# Patient Record
Sex: Female | Born: 1970 | Race: White | Hispanic: No | Smoking: Current every day smoker
Health system: Southern US, Community
[De-identification: ages and names within clinical notes are randomized; demographics above are authoritative.]

## PROBLEM LIST (undated history)

## (undated) DIAGNOSIS — N200 Calculus of kidney: Secondary | ICD-10-CM

## (undated) DIAGNOSIS — G43909 Migraine, unspecified, not intractable, without status migrainosus: Secondary | ICD-10-CM

## (undated) DIAGNOSIS — E785 Hyperlipidemia, unspecified: Secondary | ICD-10-CM

## (undated) DIAGNOSIS — F419 Anxiety disorder, unspecified: Secondary | ICD-10-CM

## (undated) DIAGNOSIS — Z973 Presence of spectacles and contact lenses: Secondary | ICD-10-CM

## (undated) HISTORY — DX: Hyperlipidemia, unspecified: E78.5

## (undated) HISTORY — DX: Migraine, unspecified, not intractable, without status migrainosus: G43.909

## (undated) HISTORY — DX: Anxiety disorder, unspecified: F41.9

## (undated) HISTORY — PX: TUBAL LIGATION: SHX77

---

## 2006-03-09 ENCOUNTER — Observation Stay: Payer: Self-pay

## 2006-03-14 ENCOUNTER — Observation Stay: Payer: Self-pay

## 2006-03-22 ENCOUNTER — Inpatient Hospital Stay: Payer: Self-pay | Admitting: Obstetrics and Gynecology

## 2010-12-05 ENCOUNTER — Emergency Department: Payer: Self-pay | Admitting: Emergency Medicine

## 2016-02-15 ENCOUNTER — Encounter: Payer: Self-pay | Admitting: Emergency Medicine

## 2016-02-15 ENCOUNTER — Emergency Department: Payer: Medicaid Other

## 2016-02-15 ENCOUNTER — Emergency Department
Admission: EM | Admit: 2016-02-15 | Discharge: 2016-02-16 | Disposition: A | Discharge status: Home / Self Care | Payer: Medicaid Other | Attending: Emergency Medicine | Admitting: Emergency Medicine

## 2016-02-15 DIAGNOSIS — F17210 Nicotine dependence, cigarettes, uncomplicated: Secondary | ICD-10-CM | POA: Insufficient documentation

## 2016-02-15 DIAGNOSIS — R1031 Right lower quadrant pain: Secondary | ICD-10-CM | POA: Diagnosis present

## 2016-02-15 DIAGNOSIS — N201 Calculus of ureter: Secondary | ICD-10-CM | POA: Diagnosis not present

## 2016-02-15 DIAGNOSIS — Z3202 Encounter for pregnancy test, result negative: Secondary | ICD-10-CM | POA: Diagnosis not present

## 2016-02-15 HISTORY — DX: Calculus of kidney: N20.0

## 2016-02-15 LAB — URINALYSIS COMPLETE WITH MICROSCOPIC (ARMC ONLY)
BACTERIA UA: NONE SEEN
Bilirubin Urine: NEGATIVE
GLUCOSE, UA: NEGATIVE mg/dL
HGB URINE DIPSTICK: NEGATIVE
LEUKOCYTES UA: NEGATIVE
Nitrite: NEGATIVE
PH: 9 — AB (ref 5.0–8.0)
Protein, ur: 30 mg/dL — AB
SPECIFIC GRAVITY, URINE: 1.015 (ref 1.005–1.030)

## 2016-02-15 LAB — POCT PREGNANCY, URINE: PREG TEST UR: NEGATIVE

## 2016-02-15 LAB — COMPREHENSIVE METABOLIC PANEL
ALBUMIN: 4 g/dL (ref 3.5–5.0)
ALT: 15 U/L (ref 14–54)
ANION GAP: 8 (ref 5–15)
AST: 19 U/L (ref 15–41)
Alkaline Phosphatase: 73 U/L (ref 38–126)
BILIRUBIN TOTAL: 0.8 mg/dL (ref 0.3–1.2)
BUN: 13 mg/dL (ref 6–20)
CALCIUM: 9.3 mg/dL (ref 8.9–10.3)
CO2: 21 mmol/L — ABNORMAL LOW (ref 22–32)
Chloride: 111 mmol/L (ref 101–111)
Creatinine, Ser: 1.03 mg/dL — ABNORMAL HIGH (ref 0.44–1.00)
GFR calc Af Amer: >60 mL/min (ref >60)
GFR calc non Af Amer: >60 mL/min (ref >60)
GLUCOSE: 96 mg/dL (ref 65–99)
POTASSIUM: 3.5 mmol/L (ref 3.5–5.1)
Sodium: 140 mmol/L (ref 135–145)
Total Protein: 7.3 g/dL (ref 6.5–8.1)

## 2016-02-15 LAB — CBC
HCT: 41.3 % (ref 35.0–47.0)
HEMOGLOBIN: 13.8 g/dL (ref 12.0–16.0)
MCH: 30.4 pg (ref 26.0–34.0)
MCHC: 33.5 g/dL (ref 32.0–36.0)
MCV: 90.9 fL (ref 80.0–100.0)
Platelets: 247 10*3/uL (ref 150–440)
RBC: 4.54 MIL/uL (ref 3.80–5.20)
RDW: 12.5 % (ref 11.5–14.5)
WBC: 11.5 10*3/uL — ABNORMAL HIGH (ref 3.6–11.0)

## 2016-02-15 LAB — LIPASE, BLOOD: LIPASE: 28 U/L (ref 11–51)

## 2016-02-15 MED ORDER — SODIUM CHLORIDE 0.9 % IV BOLUS (SEPSIS)
1000.0000 mL | Freq: Once | INTRAVENOUS | Status: AC
  Administered 2016-02-15: 1000 mL via INTRAVENOUS

## 2016-02-15 MED ORDER — KETOROLAC TROMETHAMINE 60 MG/2ML IM SOLN
INTRAMUSCULAR | Status: AC
Start: 2016-02-15 — End: 2016-02-15
  Administered 2016-02-15: 30 mg
  Filled 2016-02-15: qty 2

## 2016-02-15 MED ORDER — SODIUM CHLORIDE 0.9 % IV SOLN
INTRAVENOUS | Status: DC
  Administered 2016-02-15: via INTRAVENOUS

## 2016-02-15 MED ORDER — MORPHINE SULFATE (PF) 4 MG/ML IV SOLN
4.0000 mg | Freq: Once | INTRAVENOUS | Status: AC
  Administered 2016-02-15: 4 mg via INTRAVENOUS
  Filled 2016-02-15: qty 1

## 2016-02-15 MED ORDER — ONDANSETRON HCL 4 MG/2ML IJ SOLN
4.0000 mg | Freq: Once | INTRAMUSCULAR | Status: AC
  Administered 2016-02-15: 4 mg via INTRAVENOUS
  Filled 2016-02-15: qty 2

## 2016-02-15 MED ORDER — KETOROLAC TROMETHAMINE 30 MG/ML IJ SOLN
30.0000 mg | Freq: Once | INTRAMUSCULAR | Status: AC
  Administered 2016-02-15: 30 mg via INTRAVENOUS
  Filled 2016-02-15: qty 1

## 2016-02-15 MED ORDER — KETOROLAC TROMETHAMINE 30 MG/ML IJ SOLN: 30.0000 mg | Freq: Once | INTRAMUSCULAR | Status: DC

## 2016-02-15 NOTE — ED Notes (Signed)
C/o right lower back and right lower quadrant pain x 2 days.  Vomiting onset today.  Denies Urinary symptoms.

## 2016-02-15 NOTE — ED Provider Notes (Signed)
[facility] Emergency Department Provider Note  ____________________________________________  Time seen: Approximately 11:34 PM  I have reviewed the triage vital signs and the nursing notes.   HISTORY  Chief Complaint Flank Pain and Abdominal Pain    HPI Shelby Copeland is a 45 y.o. female with a past medical history that includes one episode of kidney stone which she passed on her own several years ago who presents with gradual onset of 2 days of right-sided flank pain and right lower quadrant pain.  It started gradually and was mild but his increased in intensity and is now severe.  She describes it as sharp, stabbing, and pressure-like.  She had some nausea over the last 2 days and had several episodes of vomiting today.  She denies fever/chills, chest pain, shortness of breath, upper abdominal pain, dysuria, and hematuria.  She does state it feels like her prior kidney stone but worse.  She has had a tubal ligation and is having no pelvic or vaginal symptoms.  Smokes tobacco but does not drink alcohol.   Past Medical History  Diagnosis Date  . Kidney stones     There are no active problems to display for this patient.   Past Surgical History  Procedure Laterality Date  . Tubal ligation      Current Outpatient Rx  Name  Route  Sig  Dispense  Refill  .           .           .           .             Allergies Review of patient's allergies indicates no known allergies.  History reviewed. No pertinent family history.  Social History Social History  Substance Use Topics  . Smoking status: Current Every Day Smoker    Types: Cigarettes  . Smokeless tobacco: None  . Alcohol Use: No    Review of Systems Constitutional: No fever/chills Eyes: No visual changes. ENT: No sore throat. Cardiovascular: Denies chest pain. Respiratory: Denies shortness of breath. Gastrointestinal: Right lower quadrant abdominal pain that is originating in the flank  and radiating forward.  Several episodes of vomiting.  No diarrhea Genitourinary: Negative for dysuria.  No hematuria Musculoskeletal: Right-sided flank pain Skin: Negative for rash. Neurological: Negative for headaches, focal weakness or numbness.  10-point ROS otherwise negative.  ____________________________________________   PHYSICAL EXAM:  VITAL SIGNS: ED Triage Vitals  Enc Vitals Group     BP 02/15/16 1837 155/77 mmHg     Pulse Rate 02/15/16 1837 73     Resp 02/15/16 1837 20     Temp 02/15/16 1837 98.3 F (36.8 C)     Temp Source 02/15/16 1837 Oral     SpO2 02/15/16 1837 98 %     Weight 02/15/16 1837 190 lb (86.183 kg)     Height 02/15/16 1837 5\' 5"  (1.651 m)     Head Cir --      Peak Flow --      Pain Score 02/15/16 1849 10     Pain Loc --      Pain Edu? --      Excl. in GC? --     Constitutional: Alert and oriented. Well appearing and appears mildly uncomfortable but in no severe distress Eyes: Conjunctivae are normal. PERRL. EOMI. Head: Atraumatic. Nose: No congestion/rhinnorhea. Mouth/Throat: Mucous membranes are moist.  Oropharynx non-erythematous. Neck: No stridor.   Cardiovascular: Normal rate,  regular rhythm. Grossly normal heart sounds.  Good peripheral circulation. Respiratory: Normal respiratory effort.  No retractions. Lungs CTAB. Gastrointestinal: Obese, mild tenderness to palpation of the right side of the abdomen with right-sided CVA tenderness that is severe.  No rebound or guarding.  No distention.. Musculoskeletal: No lower extremity tenderness nor edema.  No joint effusions. Neurologic:  Normal speech and language. No gross focal neurologic deficits are appreciated.  Skin:  Skin is warm, dry and intact. No rash noted. Psychiatric: Mood and affect are normal. Speech and behavior are normal.  ____________________________________________   LABS (all labs ordered are listed, but only abnormal results are displayed)  Labs Reviewed   COMPREHENSIVE METABOLIC PANEL - Abnormal; Notable for the following:    CO2 21 (*)    Creatinine, Ser 1.03 (*)    All other components within normal limits  CBC - Abnormal; Notable for the following:    WBC 11.5 (*)    All other components within normal limits  URINALYSIS COMPLETEWITH MICROSCOPIC ([facility] ONLY) - Abnormal; Notable for the following:    Color, Urine YELLOW (*)    APPearance TURBID (*)    Ketones, ur TRACE (*)    pH 9.0 (*)    Protein, ur 30 (*)    Squamous Epithelial / LPF 6-30 (*)    All other components within normal limits  LIPASE, BLOOD  PREGNANCY, URINE  POCT PREGNANCY, URINE   ____________________________________________  EKG  None ____________________________________________  RADIOLOGY   Ct Renal Stone Study  02/15/2016  CLINICAL DATA:  Bilateral lower abdominal and back pain for 2 days. EXAM: CT ABDOMEN AND PELVIS WITHOUT CONTRAST TECHNIQUE: Multidetector CT imaging of the abdomen and pelvis was performed following the standard protocol without IV contrast. COMPARISON:  12/05/2010 FINDINGS: Lower chest:  Trace subsegmental atelectasis noted at the left base. Hepatobiliary: No focal abnormality in the liver on this study without intravenous contrast. No evidence of hepatomegaly. There is no evidence for gallstones, gallbladder wall thickening, or pericholecystic fluid. No intrahepatic or extrahepatic biliary dilation. Pancreas: No focal mass lesion. No dilatation of the main duct. No intraparenchymal cyst. No peripancreatic edema. Spleen: No splenomegaly. No focal mass lesion. Adrenals/Urinary Tract: No adrenal nodule or mass. 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 Mild right perinephric edema is evident. 5 x 5 x 7 mm stone is identified at the right UVJ. No evidence for right hydroureter. Left kidney is unremarkable. No left hydroureter. No ureteral stones. No bladder stones. Stomach/Bowel: Stomach is  nondistended. No gastric wall thickening. No evidence of outlet obstruction. Duodenum is normally positioned as is the ligament of Treitz. No small bowel wall thickening. No small bowel dilatation. The terminal ileum is normal. The appendix is normal. 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 Vascular/Lymphatic: There is abdominal aortic atherosclerosis without aneurysm. There is no gastrohepatic or hepatoduodenal ligament lymphadenopathy. No intraperitoneal or retroperitoneal lymphadenopathy. No pelvic sidewall lymphadenopathy. Reproductive: The uterus has normal CT imaging appearance. There is no adnexal mass. Other: No intraperitoneal free fluid. Musculoskeletal: Bone windows reveal no worrisome lytic or sclerotic osseous lesions. IMPRESSION: 1. Mild right hydronephrosis secondary to a 5 x 5 x 7 mm stone at the right UVJ. Electronically Signed   By: Kennith Center M.D.   On: 02/15/2016 22:35    ____________________________________________   PROCEDURES  Procedure(s) performed: None  Critical Care performed: No ____________________________________________   INITIAL IMPRESSION / ASSESSMENT AND PLAN / ED COURSE  Pertinent labs &  imaging results that were available during my care of the patient were reviewed by me and considered in my medical decision making (see chart for details).  The patient has a relatively large but nearly passed stone in her right UVJ.  She has no evidence of infection and does not require antibiotics.  After Toradol her pain has improved but is still present.  I had a long talk with the patient about conservative management and outpatient follow-up and possible lithotripsy in 3 days.  I spoke by phone with Dr. Apolinar Junes, the on-call urologist, who agreed with my plan.  We gave a dose of morphine 4 mg IV and Zofran 4 mg IV in the emergency department as well as 2 Percocet.  I am providing prescriptions for Percocet, Zofran, Flomax, and  Colace.  Dr. Apolinar Junes told me that the patient can be seen as a walk-in tomorrow and I relayed this to the patient and her daughter.  They understand and agree with the plan.  I gave my usual and customary return precautions.     ____________________________________________  FINAL CLINICAL IMPRESSION(S) / ED DIAGNOSES  Final diagnoses:  Ureterolithiasis      NEW MEDICATIONS STARTED DURING THIS VISIT:  New Prescriptions   DOCUSATE SODIUM (COLACE) 100 MG CAPSULE    Take 1 tablet once or twice daily as needed for constipation while taking narcotic pain medicine   ONDANSETRON (ZOFRAN) 4 MG TABLET    Take 1-2 tabs by mouth every 8 hours as needed for nausea/vomiting   OXYCODONE-ACETAMINOPHEN (ROXICET) 5-325 MG TABLET    Take 1-2 tablets by mouth every 4 (four) hours as needed for severe pain.   TAMSULOSIN (FLOMAX) 0.4 MG CAPS CAPSULE    Take 1 tablet by mouth daily until you pass the kidney stone or no longer have symptoms     Loleta Rose, MD 02/16/16 0003

## 2016-02-15 NOTE — ED Notes (Signed)
Pt requesting pain medication. MD notified, see MAR.

## 2016-02-16 ENCOUNTER — Telehealth: Payer: Self-pay | Admitting: Urology

## 2016-02-16 LAB — PREGNANCY, URINE: Preg Test, Ur: NEGATIVE

## 2016-02-16 MED ORDER — OXYCODONE-ACETAMINOPHEN 5-325 MG PO TABS
2.0000 | ORAL_TABLET | Freq: Once | ORAL | Status: AC
  Administered 2016-02-16: 2 via ORAL
  Filled 2016-02-16: qty 2

## 2016-02-16 MED ORDER — DOCUSATE SODIUM 100 MG PO CAPS: ORAL_CAPSULE | ORAL | Status: DC

## 2016-02-16 MED ORDER — ONDANSETRON HCL 4 MG PO TABS: ORAL_TABLET | ORAL | Status: DC

## 2016-02-16 MED ORDER — OXYCODONE-ACETAMINOPHEN 5-325 MG PO TABS: 1.0000 | ORAL_TABLET | ORAL | Status: DC | PRN

## 2016-02-16 MED ORDER — TAMSULOSIN HCL 0.4 MG PO CAPS: ORAL_CAPSULE | ORAL | Status: DC

## 2016-02-16 NOTE — Telephone Encounter (Signed)
error 

## 2016-02-16 NOTE — Discharge Instructions (Signed)
You have been seen in the Emergency Department (ED) today for pain that we believe based on your workup, is caused by kidney stones.  As we have discussed, please drink plenty of fluids.  Please make a follow up appointment with the physician(s) listed elsewhere in this documentation.  You may take pain medication as needed but ONLY as prescribed.  Please also take your prescribed Flomax daily.  Do not take any ibuprofen nor naproxen (or any NSAIDs) because you may need lithotripsy on Thursday.  Please see your doctor as soon as possible as stones may take 1-3 weeks to pass and you may require additional care or medications.  Dr. Apolinar Junes (urology) said you should come by her clinic for a walk-in appointment later today.  Do not drink alcohol, drive or participate in any other potentially dangerous activities while taking opiate pain medication as it may make you sleepy. Do not take this medication with any other sedating medications, either prescription or over-the-counter. If you were prescribed Percocet or Vicodin, do not take these with acetaminophen (Tylenol) as it is already contained within these medications.   Take Percocet as needed for severe pain.  This medication is an opiate (or narcotic) pain medication and can be habit forming.  Use it as little as possible to achieve adequate pain control.  Do not use or use it with extreme caution if you have a history of opiate abuse or dependence.  If you are on a pain contract with your primary care doctor or a pain specialist, be sure to let them know you were prescribed this medication today from the [facility].  This medication is intended for your use only - do not give any to anyone else and keep it in a secure place where nobody else, especially children, have access to it.  It will also cause or worsen constipation, so you may want to consider taking an over-the-counter stool softener while you are taking this  medication.  Return to the Emergency Department (ED) or call your doctor if you have any worsening pain, fever, painful urination, are unable to urinate, or develop other symptoms that concern you.    Kidney Stones Kidney stones (urolithiasis) are deposits that form inside your kidneys. The intense pain is caused by the stone moving through the urinary tract. When the stone moves, the ureter goes into spasm around the stone. The stone is usually passed in the urine.  CAUSES   A disorder that makes certain neck glands produce too much parathyroid hormone (primary hyperparathyroidism).  A buildup of uric acid crystals, similar to gout in your joints.  Narrowing (stricture) of the ureter.  A kidney obstruction present at birth (congenital obstruction).  Previous surgery on the kidney or ureters.  Numerous kidney infections. SYMPTOMS   Feeling sick to your stomach (nauseous).  Throwing up (vomiting).  Blood in the urine (hematuria).  Pain that usually spreads (radiates) to the groin.  Frequency or urgency of urination. DIAGNOSIS   Taking a history and physical exam.  Blood or urine tests.  CT scan.  Occasionally, an examination of the inside of the urinary bladder (cystoscopy) is performed. TREATMENT   Observation.  Increasing your fluid intake.  Extracorporeal shock wave lithotripsy--This is a noninvasive procedure that uses shock waves to break up kidney stones.  Surgery may be needed if you have severe pain or persistent obstruction. There are various surgical procedures. Most of the procedures are performed with the use of small instruments. Only  small incisions are needed to accommodate these instruments, so recovery time is minimized. The size, location, and chemical composition are all important variables that will determine the proper choice of action for you. Talk to your health care provider to better understand your situation so that you will minimize the risk  of injury to yourself and your kidney.  HOME CARE INSTRUCTIONS   Drink enough water and fluids to keep your urine clear or pale yellow. This will help you to pass the stone or stone fragments.  Strain all urine through the provided strainer. Keep all particulate matter and stones for your health care provider to see. The stone causing the pain may be as small as a grain of salt. It is very important to use the strainer each and every time you pass your urine. The collection of your stone will allow your health care provider to analyze it and verify that a stone has actually passed. The stone analysis will often identify what you can do to reduce the incidence of recurrences.  Only take over-the-counter or prescription medicines for pain, discomfort, or fever as directed by your health care provider.  Keep all follow-up visits as told by your health care provider. This is important.  Get follow-up X-rays if required. The absence of pain does not always mean that the stone has passed. It may have only stopped moving. If the urine remains completely obstructed, it can cause loss of kidney function or even complete destruction of the kidney. It is your responsibility to make sure X-rays and follow-ups are completed. Ultrasounds of the kidney can show blockages and the status of the kidney. Ultrasounds are not associated with any radiation and can be performed easily in a matter of minutes.  Make changes to your daily diet as told by your health care provider. You may be told to:  Limit the amount of salt that you eat.  Eat 5 or more servings of fruits and vegetables each day.  Limit the amount of meat, poultry, fish, and eggs that you eat.  Collect a 24-hour urine sample as told by your health care provider.You may need to collect another urine sample every 6-12 months. SEEK MEDICAL CARE IF:  You experience pain that is progressive and unresponsive to any pain medicine you have been  prescribed. SEEK IMMEDIATE MEDICAL CARE IF:   Pain cannot be controlled with the prescribed medicine.  You have a fever or shaking chills.  The severity or intensity of pain increases over 18 hours and is not relieved by pain medicine.  You develop a new onset of abdominal pain.  You feel faint or pass out.  You are unable to urinate.   This information is not intended to replace advice given to you by your health care provider. Make sure you discuss any questions you have with your health care provider.   Document Released: 12/12/2005 Document Revised: 09/02/2015 Document Reviewed: 05/15/2013 [employer] Interactive Patient Education [employer]

## 2016-02-17 ENCOUNTER — Ambulatory Visit (INDEPENDENT_AMBULATORY_CARE_PROVIDER_SITE_OTHER): Payer: Medicaid Other | Admitting: Urology

## 2016-02-17 ENCOUNTER — Ambulatory Visit
Admission: RE | Admit: 2016-02-17 | Discharge: 2016-02-17 | Disposition: A | Discharge status: Home / Self Care | Payer: Medicaid Other | Source: Ambulatory Visit | Attending: Urology | Admitting: Urology

## 2016-02-17 ENCOUNTER — Encounter: Payer: Self-pay | Admitting: Urology

## 2016-02-17 VITALS — BP 115/77 | HR 103 | Ht 65.0 in | Wt 195.0 lb

## 2016-02-17 DIAGNOSIS — Q649 Congenital malformation of urinary system, unspecified: Secondary | ICD-10-CM

## 2016-02-17 DIAGNOSIS — N201 Calculus of ureter: Secondary | ICD-10-CM

## 2016-02-17 DIAGNOSIS — Q625 Duplication of ureter: Secondary | ICD-10-CM

## 2016-02-17 DIAGNOSIS — R109 Unspecified abdominal pain: Secondary | ICD-10-CM

## 2016-02-17 LAB — URINALYSIS, COMPLETE
BILIRUBIN UA: NEGATIVE
Glucose, UA: NEGATIVE
KETONES UA: NEGATIVE
NITRITE UA: NEGATIVE
Protein, UA: NEGATIVE
SPEC GRAV UA: 1.025 (ref 1.005–1.030)
UUROB: 0.2 mg/dL (ref 0.2–1.0)
pH, UA: 5 (ref 5.0–7.5)

## 2016-02-17 LAB — MICROSCOPIC EXAMINATION: Epithelial Cells (non renal): >10 /hpf — AB (ref 0–10)

## 2016-02-17 NOTE — Progress Notes (Addendum)
02/17/2016 10:27 AM   Shelby Copeland Deneen Harts 08-Apr-1971 [id number]  Referring provider: Katheren Shams [address] [place], [place] [address]  Chief Complaint  Patient presents with  . Nephrolithiasis    New Patient    HPI: 45 year old female who is seen in the emergency department on 02/15/2016 with 2 days of gradual onset right flank pain/right lower quadrant pain, nausea and vomiting found to have a 7 mm right UVJ calcification with mild right hydronephrosis who presents today for urological follow-up.  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  In the emergency room, she did have a mild leukocytosis to 11.5, UA negative, afebrile, and otherwise hemodynamically stable. Her pain was able to be controlled and she was discharged with close urologic follow-up.  She has been straining her urine and not seen the stone.  She has not been taking NSAIDS/ ASA.  She is taking opioids, Zofran, and Flomax prescribed by the emergency room on a regular basis. She continues to have some right lower quadrant pain.  She does have a personal history of stones.  She is has been able  to pass her previous stones any intervention.   PMH: Past Medical History  Diagnosis Date  . Kidney stones   . Anxiety   . Migraine   . Hyperlipemia     Surgical History: Past Surgical History  Procedure Laterality Date  . Tubal ligation      Home Medications:    Medication List       This list is accurate as of: 02/17/16 10:27 AM.  Always use your most recent med list.               docusate sodium 100 MG capsule  Commonly known as:  COLACE  Take 1 tablet once or twice daily as needed for constipation while taking narcotic pain medicine     ondansetron 4 MG tablet  Commonly known as:  ZOFRAN  Take 1-2 tabs by mouth every 8 hours as needed for nausea/vomiting     oxyCODONE-acetaminophen 5-325 MG tablet  Commonly known as:  ROXICET  Take 1-2 tablets by mouth  every 4 (four) hours as needed for severe pain.     tamsulosin 0.4 MG Caps capsule  Commonly known as:  FLOMAX  Take 1 tablet by mouth daily until you pass the kidney stone or no longer have symptoms     TOPAMAX PO  Take by mouth.        Allergies: No Known Allergies  Family History: Family History  Problem Relation Age of Onset  . Bladder Cancer Neg Hx   . Kidney cancer Neg Hx   . Prostate cancer Neg Hx   . Cancer Father   . Nephrolithiasis Maternal Grandfather     Social History:  reports that she has been smoking Cigarettes.  She has been smoking about 0.50 packs per day. She does not have any smokeless tobacco history on file. She reports that she drinks alcohol. She reports that she does not use illicit drugs.  ROS: UROLOGY Frequent Urination?: No Hard to postpone urination?: No Burning/pain with urination?: No Get up at night to urinate?: No Leakage of urine?: No Urine stream starts and stops?: No Trouble starting stream?: No Do you have to strain to urinate?: No Blood in urine?: Yes Urinary tract infection?: No Sexually transmitted disease?: No Injury to kidneys or bladder?: No Painful intercourse?: No Weak stream?: No Currently pregnant?: No Vaginal bleeding?: No Last menstrual period?:  01-15-16  Gastrointestinal Nausea?: Yes Vomiting?: No Indigestion/heartburn?: Yes Diarrhea?: No Constipation?: No  Constitutional Fever: Yes Night sweats?: No Weight loss?: No Fatigue?: No  Skin Skin rash/lesions?: No Itching?: No  Eyes Blurred vision?: No Double vision?: No  Ears/Nose/Throat Sore throat?: No Sinus problems?: No  Hematologic/Lymphatic Swollen glands?: No Easy bruising?: No  Cardiovascular Leg swelling?: No Chest pain?: No  Respiratory Cough?: No Shortness of breath?: No  Endocrine Excessive thirst?: Yes  Musculoskeletal Back pain?: Yes Joint pain?: No  Neurological Headaches?: No Dizziness?:  Yes  Psychologic Depression?: No Anxiety?: Yes  Physical Exam: BP 115/77 mmHg  Pulse 103  Ht 5' 5"  (1.651 m)  Wt 195 lb (88.451 kg)  BMI 32.45 kg/m2  LMP 01/15/2016  Constitutional:  Alert and oriented, No acute distress. Accompanied by daughter today. HEENT: [place] AT, moist mucus membranes.  Trachea midline, no masses.  Pinpoint pupils. Cardiovascular: No clubbing, cyanosis, or edema. Respiratory: Normal respiratory effort, no increased work of breathing. GI: Abdomen is soft, nontender, nondistended, no abdominal masses GU: No CVA tenderness. Skin: No rashes, bruises or suspicious lesions. Lymph: No cervical or inguinal adenopathy. Neurologic: Grossly intact, no focal deficits, moving all 4 extremities. Psychiatric: Normal mood and affect.  Laboratory Data: Lab Results  Component Value Date   WBC 11.5* 02/15/2016   HGB 13.8 02/15/2016   HCT 41.3 02/15/2016   MCV 90.9 02/15/2016   PLT 247 02/15/2016    Lab Results  Component Value Date   CREATININE 1.03* 02/15/2016    Urinalysis    Component Value Date/Time   COLORURINE YELLOW* 02/15/2016 1838   APPEARANCEUR TURBID* 02/15/2016 1838   LABSPEC 1.015 02/15/2016 1838   PHURINE 9.0* 02/15/2016 1838   GLUCOSEU NEGATIVE 02/15/2016 1838   HGBUR NEGATIVE 02/15/2016 1838   BILIRUBINUR NEGATIVE 02/15/2016 1838   KETONESUR TRACE* 02/15/2016 1838   PROTEINUR 30* 02/15/2016 1838   NITRITE NEGATIVE 02/15/2016 1838   LEUKOCYTESUR NEGATIVE 02/15/2016 1838    Pertinent Imaging:  CLINICAL DATA: Bilateral lower abdominal and back pain for 2 days.  EXAM: CT ABDOMEN AND PELVIS WITHOUT CONTRAST  TECHNIQUE: Multidetector CT imaging of the abdomen and pelvis was performed following the standard protocol without IV contrast.  COMPARISON: 12/05/2010  FINDINGS: Lower chest: Trace subsegmental atelectasis noted at the left base.  Hepatobiliary: No focal abnormality in the liver on this study without intravenous  contrast. No evidence of hepatomegaly. There is no evidence for gallstones, gallbladder wall thickening, or pericholecystic fluid. No intrahepatic or extrahepatic biliary dilation.  Pancreas: No focal mass lesion. No dilatation of the main duct. No intraparenchymal cyst. No peripancreatic edema.  Spleen: No splenomegaly. No focal mass lesion.  Adrenals/Urinary Tract: No adrenal nodule or mass. 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 Mild right perinephric edema is evident. 5 x 5 x 7 mm stone is identified at the right UVJ. No evidence for right hydroureter. Left kidney is unremarkable. No left hydroureter. No ureteral stones. No bladder stones.  Stomach/Bowel: Stomach is nondistended. No gastric wall thickening. No evidence of outlet obstruction. Duodenum is normally positioned as is the ligament of Treitz. No small bowel wall thickening. No small bowel dilatation. The terminal ileum is normal. The appendix is normal. 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  Vascular/Lymphatic: There is abdominal aortic atherosclerosis without aneurysm. There is no gastrohepatic or hepatoduodenal ligament lymphadenopathy. No intraperitoneal or retroperitoneal lymphadenopathy. No pelvic sidewall lymphadenopathy.  Reproductive: The uterus has normal CT imaging  appearance. There is no adnexal mass.  Other: No intraperitoneal free fluid.  Musculoskeletal: Bone windows reveal no worrisome lytic or sclerotic osseous lesions.  IMPRESSION: 1. Mild right hydronephrosis secondary to a 5 x 5 x 7 mm stone at the right UVJ.   Electronically Signed  By: Misty Stanley M.D.  On: 02/15/2016 22:35  CT scan personally reviewed today with patient Upon review the CT scan, there may also be a small 4 mm proximal right ureteral stone versus vascular calcification in addition to the  above.   Assessment & Plan:  45 year old female with 7 mm right UVJ stone, possible 4 mm proximal right ureteral stone versus vascular calcification. She continues to have flank pain consistent with persistent ureteral calculus. No evidence of infection.  1. Ureterolithiasis We discussed various treatment options including continued MET, ESWL vs. ureteroscopy, laser lithotripsy, and stent. We discussed the risks and benefits of both including bleeding, infection, damage to surrounding structures, efficacy with need for possible further intervention, and need for temporary ureteral stent.  She is most interested in ESWL tomorrow given her persistent pain.  I offered to treat her right distal ureteral calculus if it is visible on x-ray today.  It is unclear whether she has a proximal ureteral stone versus vascular calcification in addition to the above (not noted by radiologist).. We will treat her distal stone and assess whether her pain resolves. If it is indeed a proximal ureteral stone, she should pass this spontaneously based on the size.  Risks of ESWL reviewed. Consent signed.  - Urinalysis, Complete  2. Right flank pain As above  3. Duplicated collecting system Incidental   Schedule R eswl for 7 mm right UVJ stone  Hollice Espy, MD  [facility] [address], [place], [place] [address] [phone]   02/17/2016 2:38 pm ADDENDUM: F/u KUB shows 3 mm stone at L4 and pelvic stone likely phlebolith.  CT scan reviewed and agree with assessment.  Called patient to discuss.  She understands that she has much higher chance of passing 3 mm proximal ureteral stone with MET but still would prefer to treat it with ESWL.    Plan for EWSL of 3 mm PROXIMAL ureteral stone.    Hollice Espy, MD

## 2016-02-18 ENCOUNTER — Ambulatory Visit
Admission: RE | Admit: 2016-02-18 | Discharge: 2016-02-18 | Disposition: A | Discharge status: Home / Self Care | Payer: Medicaid Other | Source: Ambulatory Visit | Attending: Urology | Admitting: Urology

## 2016-02-18 ENCOUNTER — Encounter
Admission: RE | Disposition: A | Discharge status: Home / Self Care | Payer: Self-pay | Source: Ambulatory Visit | Attending: Urology

## 2016-02-18 ENCOUNTER — Ambulatory Visit: Payer: Medicaid Other

## 2016-02-18 ENCOUNTER — Encounter: Payer: Self-pay | Admitting: *Deleted

## 2016-02-18 DIAGNOSIS — F17200 Nicotine dependence, unspecified, uncomplicated: Secondary | ICD-10-CM | POA: Insufficient documentation

## 2016-02-18 DIAGNOSIS — F419 Anxiety disorder, unspecified: Secondary | ICD-10-CM | POA: Diagnosis not present

## 2016-02-18 DIAGNOSIS — Z79899 Other long term (current) drug therapy: Secondary | ICD-10-CM | POA: Diagnosis not present

## 2016-02-18 DIAGNOSIS — E785 Hyperlipidemia, unspecified: Secondary | ICD-10-CM | POA: Insufficient documentation

## 2016-02-18 DIAGNOSIS — N201 Calculus of ureter: Secondary | ICD-10-CM | POA: Diagnosis present

## 2016-02-18 DIAGNOSIS — N200 Calculus of kidney: Secondary | ICD-10-CM

## 2016-02-18 DIAGNOSIS — Z9851 Tubal ligation status: Secondary | ICD-10-CM | POA: Diagnosis not present

## 2016-02-18 HISTORY — PX: EXTRACORPOREAL SHOCK WAVE LITHOTRIPSY: SHX1557

## 2016-02-18 LAB — POCT PREGNANCY, URINE: Preg Test, Ur: NEGATIVE

## 2016-02-18 SURGERY — LITHOTRIPSY, ESWL
Anesthesia: Moderate Sedation | Laterality: Right

## 2016-02-18 MED ORDER — DIAZEPAM 5 MG PO TABS
10.0000 mg | ORAL_TABLET | ORAL | Status: AC
  Administered 2016-02-18: 10 mg via ORAL

## 2016-02-18 MED ORDER — CIPROFLOXACIN HCL 500 MG PO TABS
500.0000 mg | ORAL_TABLET | Freq: Once | ORAL | Status: AC
  Administered 2016-02-18: 500 mg via ORAL

## 2016-02-18 MED ORDER — DEXTROSE-NACL 5-0.45 % IV SOLN: INTRAVENOUS | Status: DC

## 2016-02-18 MED ORDER — CIPROFLOXACIN HCL 500 MG PO TABS
ORAL_TABLET | ORAL | Status: AC
  Filled 2016-02-18: qty 1

## 2016-02-18 MED ORDER — DIAZEPAM 5 MG PO TABS
ORAL_TABLET | ORAL | Status: AC
  Filled 2016-02-18: qty 2

## 2016-02-18 MED ORDER — DIPHENHYDRAMINE HCL 25 MG PO CAPS
25.0000 mg | ORAL_CAPSULE | ORAL | Status: AC
  Administered 2016-02-18: 25 mg via ORAL

## 2016-02-18 MED ORDER — OXYCODONE-ACETAMINOPHEN 5-325 MG PO TABS
1.0000 | ORAL_TABLET | ORAL | Status: DC | PRN
  Administered 2016-02-18: 1 via ORAL

## 2016-02-18 MED ORDER — OXYCODONE-ACETAMINOPHEN 5-325 MG PO TABS
ORAL_TABLET | ORAL | Status: AC
  Administered 2016-02-18: 1 via ORAL
  Filled 2016-02-18: qty 1

## 2016-02-18 MED ORDER — DIPHENHYDRAMINE HCL 25 MG PO CAPS
ORAL_CAPSULE | ORAL | Status: AC
  Filled 2016-02-18: qty 1

## 2016-02-18 NOTE — OR Nursing (Signed)
Unable to assign nurse in nurse assign box. Leilani Able RN is caring for this patient.

## 2016-02-18 NOTE — OR Nursing (Signed)
Patient voided 100 mls bloody urine, strained without clots or fragment.

## 2016-02-18 NOTE — Discharge Instructions (Signed)

## 2016-02-18 NOTE — OR Nursing (Signed)
Discharged via wheelchair to home

## 2016-02-19 ENCOUNTER — Other Ambulatory Visit: Payer: Self-pay | Admitting: Urology

## 2016-02-19 DIAGNOSIS — N200 Calculus of kidney: Secondary | ICD-10-CM

## 2016-02-22 ENCOUNTER — Telehealth: Payer: Self-pay

## 2016-02-22 NOTE — Telephone Encounter (Signed)
Pt called the after hours triage line over the weekend stating she was constipated and was having severe pain. Spoke with pt who stated since making the call she has had a rather good BM and feels much better.

## 2016-03-03 ENCOUNTER — Ambulatory Visit (INDEPENDENT_AMBULATORY_CARE_PROVIDER_SITE_OTHER): Payer: Medicaid Other | Admitting: Urology

## 2016-03-03 ENCOUNTER — Ambulatory Visit
Admission: RE | Admit: 2016-03-03 | Discharge: 2016-03-03 | Disposition: A | Discharge status: Home / Self Care | Payer: Medicaid Other | Source: Ambulatory Visit | Attending: Urology | Admitting: Urology

## 2016-03-03 VITALS — BP 136/86 | HR 118 | Ht 65.0 in | Wt 191.9 lb

## 2016-03-03 DIAGNOSIS — Z87442 Personal history of urinary calculi: Secondary | ICD-10-CM | POA: Insufficient documentation

## 2016-03-03 DIAGNOSIS — Z09 Encounter for follow-up examination after completed treatment for conditions other than malignant neoplasm: Secondary | ICD-10-CM | POA: Insufficient documentation

## 2016-03-03 DIAGNOSIS — N200 Calculus of kidney: Secondary | ICD-10-CM

## 2016-03-03 LAB — URINALYSIS, COMPLETE
BILIRUBIN UA: NEGATIVE
GLUCOSE, UA: NEGATIVE
KETONES UA: NEGATIVE
Leukocytes, UA: NEGATIVE
NITRITE UA: NEGATIVE
PROTEIN UA: NEGATIVE
RBC UA: NEGATIVE
SPEC GRAV UA: 1.025 (ref 1.005–1.030)
UUROB: 0.2 mg/dL (ref 0.2–1.0)
pH, UA: 5.5 (ref 5.0–7.5)

## 2016-03-03 LAB — MICROSCOPIC EXAMINATION: WBC UA: NONE SEEN /HPF (ref 0–?)

## 2016-03-03 NOTE — Progress Notes (Signed)
03/03/2016 1:58 PM   Shelby Copeland [birth year] [id number]  Referring provider: Raynelle Bring [address] [place], [place] [address]  Chief Complaint  Patient presents with  . Routine Post Op    lithotripsy    HPI: The patient is a 45 year old woman who recently had a 3 mm right ureteral stone that was treated with lithotripsy. She is doing great since procedure. She has passed [place]. She's had no fevers or chills. Her pain is now gone. Her KUB shows no further stone burden.    PMH: Past Medical History  Diagnosis Date  . Kidney stones   . Anxiety   . Migraine   . Hyperlipemia     Surgical History: Past Surgical History  Procedure Laterality Date  . Tubal ligation    . Extracorporeal shock wave lithotripsy Right 02/18/2016    Procedure: EXTRACORPOREAL SHOCK WAVE LITHOTRIPSY (ESWL);  Surgeon: Hildred Laser, MD;  Location: [facility] ORS;  Service: Urology;  Laterality: Right;    Home Medications:    Medication List       This list is accurate as of: 03/03/16  1:58 PM.  Always use your most recent med list.               docusate sodium 100 MG capsule  Commonly known as:  COLACE  Take 1 tablet once or twice daily as needed for constipation while taking narcotic pain medicine     ondansetron 4 MG tablet  Commonly known as:  ZOFRAN  Take 1-2 tabs by mouth every 8 hours as needed for nausea/vomiting     oxyCODONE-acetaminophen 5-325 MG tablet  Commonly known as:  ROXICET  Take 1-2 tablets by mouth every 4 (four) hours as needed for severe pain.     tamsulosin 0.4 MG Caps capsule  Commonly known as:  FLOMAX  Take 1 tablet by mouth daily until you pass the kidney stone or no longer have symptoms     TOPAMAX PO  Take by mouth. Reported on 03/03/2016        Allergies: No Known Allergies  Family History: Family History  Problem Relation Age of Onset  . Bladder Cancer Neg Hx   . Kidney cancer Neg Hx   . Prostate cancer Neg Hx   . Cancer  Father   . Nephrolithiasis Maternal Grandfather     Social History:  reports that she has been smoking Cigarettes.  She has been smoking about 0.50 packs per day. She does not have any smokeless tobacco history on file. She reports that she drinks alcohol. She reports that she does not use illicit drugs.  ROS:                                        Physical Exam: BP 136/86 mmHg  Pulse 118  Ht  (1.651 m)  Wt 191 lb 14.4 oz (87.045 kg)  BMI 31.93 kg/m2  LMP 01/15/2016  Constitutional:  Alert and oriented, No acute distress. HEENT: [place] AT, moist mucus membranes.  Trachea midline, no masses. Cardiovascular: No clubbing, cyanosis, or edema. Respiratory: Normal respiratory effort, no increased work of breathing. GI: Abdomen is soft, nontender, nondistended, no abdominal masses GU: No CVA tenderness.  Skin: No rashes, bruises or suspicious lesions. Lymph: No cervical or inguinal adenopathy. Neurologic: Grossly intact, no focal deficits, moving all 4 extremities. Psychiatric: Normal mood and affect.  Laboratory Data:  Lab Results  Component Value Date   WBC 11.5* 02/15/2016   HGB 13.8 02/15/2016   HCT 41.3 02/15/2016   MCV 90.9 02/15/2016   PLT 247 02/15/2016    Lab Results  Component Value Date   CREATININE 1.03* 02/15/2016    No results found for: PSA  No results found for: TESTOSTERONE  No results found for: HGBA1C  Urinalysis    Component Value Date/Time   COLORURINE YELLOW* 02/15/2016 1838   APPEARANCEUR TURBID* 02/15/2016 1838   LABSPEC 1.015 02/15/2016 1838   PHURINE 9.0* 02/15/2016 1838   GLUCOSEU Negative 02/17/2016 1006   HGBUR NEGATIVE 02/15/2016 1838   BILIRUBINUR Negative 02/17/2016 1006   BILIRUBINUR NEGATIVE 02/15/2016 1838   KETONESUR TRACE* 02/15/2016 1838   PROTEINUR 30* 02/15/2016 1838   NITRITE Negative 02/17/2016 1006   NITRITE NEGATIVE 02/15/2016 1838   LEUKOCYTESUR Trace* 02/17/2016 1006   LEUKOCYTESUR NEGATIVE  02/15/2016 1838   KUB: My review the KUB shows the right mid ureteral stone is no longer present.  Assessment & Plan:    1. Nephrolithiasis The patient is now status post ESWL with no residual stone burden on KUB. She can follow-up when necessary.   No Follow-up on file.  Hildred LaserBrian James Geremiah Fussell, MD  [facility] [address], [address] [place], [place] [address] [phone]

## 2018-02-15 IMAGING — CR DG ABD 1 VIEW
1 series · 1 of 1 positions shown · non-contrast
Comparison: 02/18/2016

CLINICAL DATA: 2 week follow-up from lithotripsy. Left-sided back
soreness. No dysuria or hematuria.

EXAM:
ABDOMEN - 1 VIEW

[dg abd 1 view]
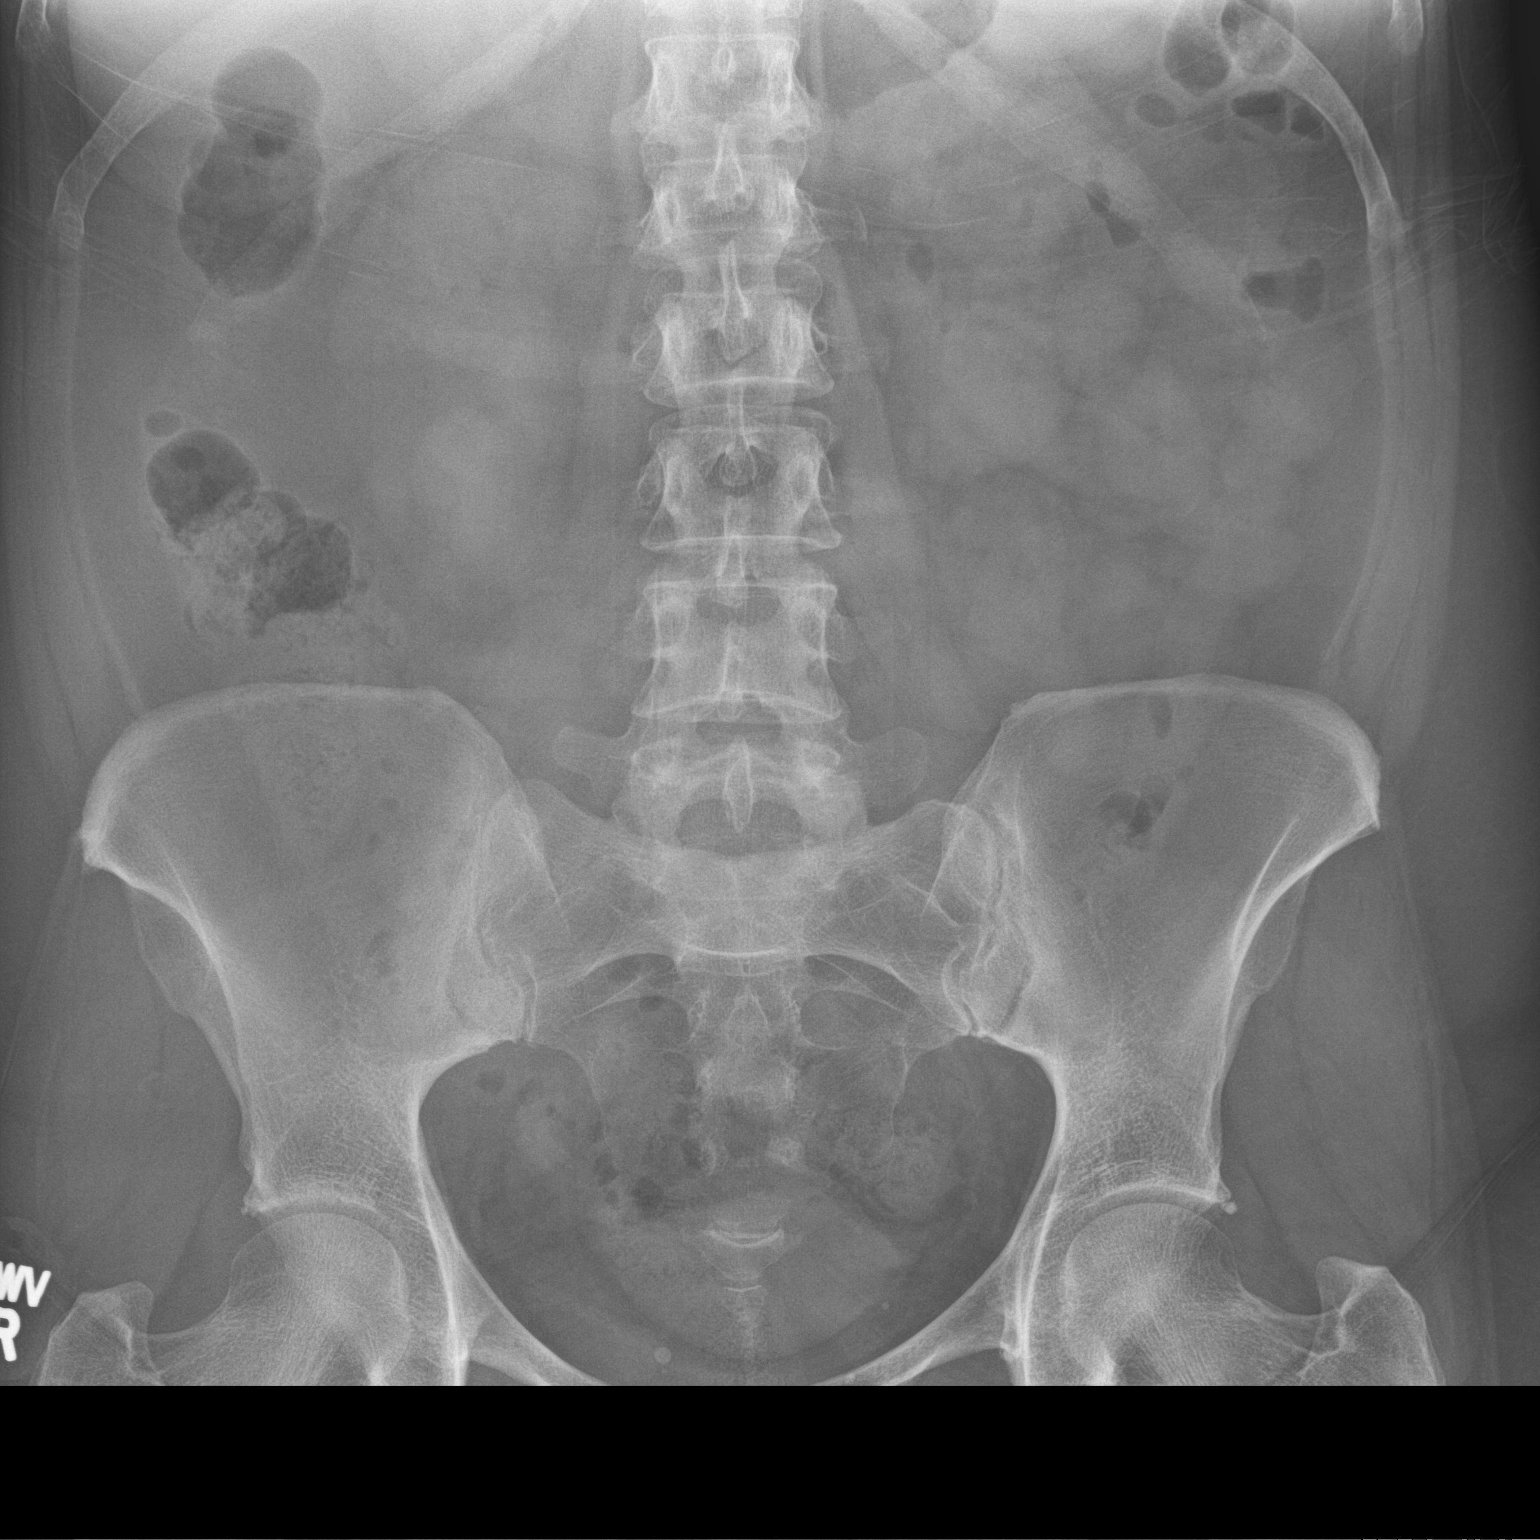

[1 of 1 positions shown; findings below may reference images not displayed]

FINDINGS: Previously seen stone projecting in the region of the proximal right
ureter is no longer present. There is no evidence of a renal or
ureteral stone. There are several stable phleboliths in the pelvis.

Normal bowel gas pattern. Soft tissues are unremarkable. Skeletal
structures are unremarkable.
IMPRESSION: No evidence of an intrarenal stone. No evidence of a residual
ureteral stone.

## 2018-06-27 ENCOUNTER — Other Ambulatory Visit: Payer: Self-pay | Admitting: Family Medicine

## 2018-06-27 DIAGNOSIS — Z1231 Encounter for screening mammogram for malignant neoplasm of breast: Secondary | ICD-10-CM

## 2022-08-17 ENCOUNTER — Other Ambulatory Visit: Payer: Self-pay | Admitting: Family Medicine

## 2022-08-17 DIAGNOSIS — Z1231 Encounter for screening mammogram for malignant neoplasm of breast: Secondary | ICD-10-CM

## 2022-09-14 ENCOUNTER — Ambulatory Visit
Admission: RE | Admit: 2022-09-14 | Discharge: 2022-09-14 | Disposition: A | Discharge status: Home / Self Care | Payer: Medicaid Other | Source: Ambulatory Visit | Attending: Family Medicine | Admitting: Family Medicine

## 2022-09-14 DIAGNOSIS — Z1231 Encounter for screening mammogram for malignant neoplasm of breast: Secondary | ICD-10-CM | POA: Diagnosis present

## 2022-09-16 ENCOUNTER — Other Ambulatory Visit: Payer: Self-pay | Admitting: Family Medicine

## 2022-09-16 DIAGNOSIS — N6489 Other specified disorders of breast: Secondary | ICD-10-CM

## 2022-09-16 DIAGNOSIS — R928 Other abnormal and inconclusive findings on diagnostic imaging of breast: Secondary | ICD-10-CM

## 2022-09-28 ENCOUNTER — Ambulatory Visit
Admission: RE | Admit: 2022-09-28 | Discharge: 2022-09-28 | Disposition: A | Discharge status: Home / Self Care | Payer: Medicaid Other | Source: Ambulatory Visit | Attending: Family Medicine | Admitting: Family Medicine

## 2022-09-28 DIAGNOSIS — R928 Other abnormal and inconclusive findings on diagnostic imaging of breast: Secondary | ICD-10-CM | POA: Insufficient documentation

## 2022-09-28 DIAGNOSIS — N6489 Other specified disorders of breast: Secondary | ICD-10-CM | POA: Insufficient documentation

## 2022-10-04 ENCOUNTER — Other Ambulatory Visit: Payer: Self-pay | Admitting: Family Medicine

## 2022-10-04 DIAGNOSIS — N63 Unspecified lump in unspecified breast: Secondary | ICD-10-CM

## 2022-10-04 DIAGNOSIS — R928 Other abnormal and inconclusive findings on diagnostic imaging of breast: Secondary | ICD-10-CM

## 2022-10-11 ENCOUNTER — Inpatient Hospital Stay: Admission: RE | Admit: 2022-10-11 | Payer: Medicaid Other | Source: Ambulatory Visit

## 2022-10-12 ENCOUNTER — Ambulatory Visit
Admission: RE | Admit: 2022-10-12 | Discharge: 2022-10-12 | Disposition: A | Discharge status: Home / Self Care | Payer: Medicaid Other | Source: Ambulatory Visit | Attending: Family Medicine | Admitting: Family Medicine

## 2022-10-12 DIAGNOSIS — R928 Other abnormal and inconclusive findings on diagnostic imaging of breast: Secondary | ICD-10-CM | POA: Diagnosis present

## 2022-10-12 DIAGNOSIS — N63 Unspecified lump in unspecified breast: Secondary | ICD-10-CM

## 2022-10-12 HISTORY — PX: OTHER SURGICAL HISTORY: SHX169

## 2022-10-12 MED ORDER — EPINEPHRINE PF 1 MG/ML IJ SOLN
3.0000 mg | Freq: Once | INTRAMUSCULAR | Status: DC
  Filled 2022-10-12: qty 3

## 2022-10-12 MED ORDER — LIDOCAINE-EPINEPHRINE 1 %-1:100000 IJ SOLN
3.0000 mL | Freq: Once | INTRAMUSCULAR | Status: AC
  Administered 2022-10-12: 3 mL via INTRADERMAL
  Filled 2022-10-12: qty 3

## 2022-10-12 MED ORDER — LIDOCAINE HCL (PF) 1 % IJ SOLN
5.0000 mL | Freq: Once | INTRAMUSCULAR | Status: AC
  Administered 2022-10-12: 5 mL via SUBCUTANEOUS
  Filled 2022-10-12: qty 5

## 2022-10-13 LAB — SURGICAL PATHOLOGY

## 2023-08-23 LAB — LAB REPORT - SCANNED: EGFR: 89

## 2023-12-11 ENCOUNTER — Encounter: Payer: Self-pay | Admitting: Ophthalmology

## 2023-12-11 NOTE — Anesthesia Preprocedure Evaluation (Addendum)
Anesthesia Evaluation  Patient identified by MRN, date of birth, ID band Patient awake    Reviewed: Allergy & Precautions, H&P , NPO status , Patient's Chart, lab work & pertinent test results  Airway Mallampati: III  TM Distance: >3 FB Neck ROM: Full    Dental no notable dental hx.    Pulmonary Current Smoker   Pulmonary exam normal breath sounds clear to auscultation       Cardiovascular negative cardio ROS Normal cardiovascular exam Rhythm:Regular Rate:Normal     Neuro/Psych  Headaches  Anxiety     negative neurological ROS  negative psych ROS   GI/Hepatic negative GI ROS, Neg liver ROS,,,  Endo/Other  negative endocrine ROS    Renal/GU Renal diseasenegative Renal ROS  negative genitourinary   Musculoskeletal negative musculoskeletal ROS (+)    Abdominal   Peds negative pediatric ROS (+)  Hematology negative hematology ROS (+)   Anesthesia Other Findings Nephrolithiasis and lithotripsy hx Migraines Anxiety Wears contact lens  Reproductive/Obstetrics negative OB ROS                             Anesthesia Physical Anesthesia Plan  ASA: 2  Anesthesia Plan: General   Post-op Pain Management:    Induction: Intravenous  PONV Risk Score and Plan:   Airway Management Planned: Natural Airway and Nasal Cannula  Additional Equipment:   Intra-op Plan:   Post-operative Plan:   Informed Consent: I have reviewed the patients History and Physical, chart, labs and discussed the procedure including the risks, benefits and alternatives for the proposed anesthesia with the patient or authorized representative who has indicated his/her understanding and acceptance.     Dental Advisory Given  Plan Discussed with: Anesthesiologist, CRNA and Surgeon  Anesthesia Plan Comments: (Patient consented for risks of anesthesia including but not limited to:  - adverse reactions to medications -  risk of airway placement if required - damage to eyes, teeth, lips or other oral mucosa - nerve damage due to positioning  - sore throat or hoarseness - Damage to heart, brain, nerves, lungs, other parts of body or loss of life  Patient voiced understanding and assent.)       Anesthesia Quick Evaluation

## 2023-12-12 NOTE — Discharge Instructions (Signed)

## 2023-12-18 ENCOUNTER — Ambulatory Visit
Admission: RE | Admit: 2023-12-18 | Discharge: 2023-12-18 | Disposition: A | Discharge status: Home / Self Care | Payer: Medicaid Other | Attending: Ophthalmology | Admitting: Ophthalmology

## 2023-12-18 ENCOUNTER — Encounter
Admission: RE | Disposition: A | Discharge status: Home / Self Care | Payer: Self-pay | Source: Home / Self Care | Attending: Ophthalmology

## 2023-12-18 ENCOUNTER — Ambulatory Visit: Payer: Medicaid Other | Admitting: Anesthesiology

## 2023-12-18 ENCOUNTER — Encounter: Payer: Self-pay | Admitting: Ophthalmology

## 2023-12-18 ENCOUNTER — Other Ambulatory Visit: Payer: Self-pay

## 2023-12-18 DIAGNOSIS — F17210 Nicotine dependence, cigarettes, uncomplicated: Secondary | ICD-10-CM | POA: Insufficient documentation

## 2023-12-18 DIAGNOSIS — H2512 Age-related nuclear cataract, left eye: Secondary | ICD-10-CM | POA: Diagnosis present

## 2023-12-18 HISTORY — PX: CATARACT EXTRACTION W/PHACO: SHX586

## 2023-12-18 HISTORY — DX: Presence of spectacles and contact lenses: Z97.3

## 2023-12-18 SURGERY — PHACOEMULSIFICATION, CATARACT, WITH IOL INSERTION
Anesthesia: General | Laterality: Left

## 2023-12-18 MED ORDER — BRIMONIDINE TARTRATE-TIMOLOL 0.2-0.5 % OP SOLN
OPHTHALMIC | Status: DC | PRN
  Administered 2023-12-18: 1 [drp] via OPHTHALMIC

## 2023-12-18 MED ORDER — FENTANYL CITRATE (PF) 100 MCG/2ML IJ SOLN
INTRAMUSCULAR | Status: AC
  Filled 2023-12-18: qty 2

## 2023-12-18 MED ORDER — SIGHTPATH DOSE#1 BSS IO SOLN
INTRAOCULAR | Status: DC | PRN
  Administered 2023-12-18: 1 mL

## 2023-12-18 MED ORDER — TETRACAINE HCL 0.5 % OP SOLN
OPHTHALMIC | Status: AC
  Filled 2023-12-18: qty 4

## 2023-12-18 MED ORDER — MIDAZOLAM HCL 2 MG/2ML IJ SOLN
INTRAMUSCULAR | Status: DC | PRN
  Administered 2023-12-18 (×2): 1 mg via INTRAVENOUS

## 2023-12-18 MED ORDER — MIDAZOLAM HCL 2 MG/2ML IJ SOLN
INTRAMUSCULAR | Status: AC
  Filled 2023-12-18: qty 2

## 2023-12-18 MED ORDER — ARMC OPHTHALMIC DILATING DROPS
OPHTHALMIC | Status: AC
  Filled 2023-12-18: qty 0.5

## 2023-12-18 MED ORDER — SIGHTPATH DOSE#1 BSS IO SOLN
INTRAOCULAR | Status: DC | PRN
  Administered 2023-12-18: 15 mL via INTRAOCULAR

## 2023-12-18 MED ORDER — MOXIFLOXACIN HCL 0.5 % OP SOLN
OPHTHALMIC | Status: DC | PRN
  Administered 2023-12-18: .2 mL via OPHTHALMIC

## 2023-12-18 MED ORDER — SIGHTPATH DOSE#1 NA CHONDROIT SULF-NA HYALURON 40-17 MG/ML IO SOLN
INTRAOCULAR | Status: DC | PRN
  Administered 2023-12-18: 1 mL via INTRAOCULAR

## 2023-12-18 MED ORDER — TETRACAINE HCL 0.5 % OP SOLN
1.0000 [drp] | OPHTHALMIC | Status: DC | PRN
  Administered 2023-12-18 (×3): 1 [drp] via OPHTHALMIC

## 2023-12-18 MED ORDER — TRYPAN BLUE 0.06 % IO SOSY
PREFILLED_SYRINGE | INTRAOCULAR | Status: DC | PRN
  Administered 2023-12-18: .5 mL via INTRAOCULAR

## 2023-12-18 MED ORDER — FENTANYL CITRATE (PF) 100 MCG/2ML IJ SOLN
INTRAMUSCULAR | Status: DC | PRN
  Administered 2023-12-18 (×2): 50 ug via INTRAVENOUS

## 2023-12-18 MED ORDER — ARMC OPHTHALMIC DILATING DROPS
1.0000 | OPHTHALMIC | Status: DC | PRN
  Administered 2023-12-18 (×3): 1 via OPHTHALMIC

## 2023-12-18 MED ORDER — SIGHTPATH DOSE#1 BSS IO SOLN
INTRAOCULAR | Status: DC | PRN
  Administered 2023-12-18: 47 mL via OPHTHALMIC

## 2023-12-18 SURGICAL SUPPLY — 10 items
ANGLE REVERSE CUT SHRT 25GA (CUTTER) ×1
CATARACT SUITE SIGHTPATH (MISCELLANEOUS) ×1
CYSTOTOME ANGL RVRS SHRT 25G (CUTTER) ×1 IMPLANT
FEE CATARACT SUITE SIGHTPATH (MISCELLANEOUS) ×1 IMPLANT
GLOVE BIOGEL PI IND STRL 8 (GLOVE) ×1 IMPLANT
GLOVE SURG LX STRL 8.0 MICRO (GLOVE) ×1 IMPLANT
LENS IOL TECNIS EYHANCE 16.5 (Intraocular Lens) IMPLANT
NDL FILTER BLUNT 18X1 1/2 (NEEDLE) ×1 IMPLANT
NEEDLE FILTER BLUNT 18X1 1/2 (NEEDLE) ×1
SYR 3ML LL SCALE MARK (SYRINGE) ×1 IMPLANT

## 2023-12-18 NOTE — Op Note (Signed)
PREOPERATIVE DIAGNOSIS:  Nuclear sclerotic cataract of the left eye.   POSTOPERATIVE DIAGNOSIS:  Nuclear sclerotic cataract of the left eye.   OPERATIVE PROCEDURE:ORPROCALL@   SURGEON:  Galen Manila, MD.   ANESTHESIA:  Anesthesiologist: Marisue Humble, MD CRNA: Barbette Hair, CRNA  1.      Managed anesthesia care. 2.     0.32ml of Shugarcaine was instilled following the paracentesis   COMPLICATIONS:  Vision Blue was used to stain the anterior capsule due to very poor/ no visualization of the red reflex.    TECHNIQUE:   Stop and chop   DESCRIPTION OF PROCEDURE:  The patient was examined and consented in the preoperative holding area where the aforementioned topical anesthesia was applied to the left eye and then brought back to the Operating Room where the left eye was prepped and draped in the usual sterile ophthalmic fashion and a lid speculum was placed. A paracentesis was created with the side port blade and the anterior chamber was filled with viscoelastic. A near clear corneal incision was performed with the steel keratome. A continuous curvilinear capsulorrhexis was performed with a cystotome followed by the capsulorrhexis forceps. Hydrodissection and hydrodelineation were carried out with BSS on a blunt cannula. The lens was removed in a stop and chop  technique and the remaining cortical material was removed with the irrigation-aspiration handpiece. The capsular bag was inflated with viscoelastic and the Technis [id number] lens was placed in the capsular bag without complication. The remaining viscoelastic was removed from the eye with the irrigation-aspiration handpiece. The wounds were hydrated. The anterior chamber was flushed with BSS and the eye was inflated to physiologic pressure. 0.42ml Vigamox was placed in the anterior chamber. The wounds were found to be water tight. The eye was dressed with Combigan. The patient was given protective glasses to wear throughout the day and a  shield with which to sleep tonight. The patient was also given drops with which to begin a drop regimen today and will follow-up with me in one day. Implant Name Type Inv. Item Serial No. Manufacturer Lot No. LRB No. Used Action  LENS IOL TECNIS EYHANCE 16.5 - [id number] Intraocular Lens LENS IOL TECNIS EYHANCE 16.5 [id number] [employer]  Left 1 Implanted    Procedure(s): CATARACT EXTRACTION PHACO AND INTRAOCULAR LENS PLACEMENT (IOC) LEFT VISION BLUE 6.61 00:36.3 (Left)  Electronically signed: Galen Manila 12/18/2023 8:40 AM

## 2023-12-18 NOTE — Anesthesia Postprocedure Evaluation (Signed)
Anesthesia Post Note  Patient: DARLIS FOLLETT  Procedure(s) Performed: CATARACT EXTRACTION PHACO AND INTRAOCULAR LENS PLACEMENT (IOC) LEFT VISION BLUE 6.61 00:36.3 (Left)  Patient location during evaluation: PACU Anesthesia Type: General Level of consciousness: awake and alert Pain management: pain level controlled Vital Signs Assessment: post-procedure vital signs reviewed and stable Respiratory status: spontaneous breathing, nonlabored ventilation, respiratory function stable and patient connected to nasal cannula oxygen Cardiovascular status: stable and blood pressure returned to baseline Postop Assessment: no apparent nausea or vomiting Anesthetic complications: no   No notable events documented.   Last Vitals:  Vitals:   12/18/23 0843 12/18/23 0847  BP: 124/71   Pulse: 72   Resp: 11 12  Temp:    SpO2: 96%     Last Pain:  Vitals:   12/18/23 0843  TempSrc:   PainSc: 0-No pain                 Marisue Humble

## 2023-12-18 NOTE — H&P (Signed)
[facility]   Primary Care Physician:  [facility] Ophthalmologist: Dr. Druscilla Brownie  Pre-Procedure History & Physical: HPI:  Shelby Copeland is a 52 y.o. female here for cataract surgery.   Past Medical History:  Diagnosis Date   Anxiety    Hyperlipemia    Kidney stones    Migraine    Wears contact lenses     Past Surgical History:  Procedure Laterality Date   breast biopsy Left 10/12/2022   10:00 3 CMFN RIBBON CLIP [place] BX PATH PENDING   EXTRACORPOREAL SHOCK WAVE LITHOTRIPSY Right 02/18/2016   Procedure: EXTRACORPOREAL SHOCK WAVE LITHOTRIPSY (ESWL);  Surgeon: Hildred Laser, MD;  Location: [facility] ORS;  Service: Urology;  Laterality: Right;   TUBAL LIGATION      Prior to Admission medications   Medication Sig Start Date End Date Taking? Authorizing Provider  atorvastatin (LIPITOR) 40 MG tablet Take 40 mg by mouth daily.   Yes [provider]  citalopram (CELEXA) 40 MG tablet Take 40 mg by mouth daily.   Yes [provider]  ibuprofen (ADVIL) 800 MG tablet Take 800 mg by mouth every 8 (eight) hours as needed.   Yes [provider]  Multiple Vitamins-Minerals (AIRBORNE ELDERBERRY PO) Take by mouth daily.   Yes [provider]  omeprazole (PRILOSEC) 40 MG capsule Take 40 mg by mouth daily.   Yes [provider]  SUMAtriptan (IMITREX) 50 MG tablet Take 50 mg by mouth every 2 (two) hours as needed for migraine. May repeat in 2 hours if headache persists or recurs.    [provider]    Allergies as of 12/06/2023   (No Known Allergies)    Family History  Problem Relation Age of Onset   Bladder Cancer Neg Hx    Kidney cancer Neg Hx    Prostate cancer Neg Hx    Cancer Father    Nephrolithiasis Maternal Grandfather     Social History   Socioeconomic History   Marital status: Divorced    Spouse name: Not on file   Number of children: Not on file   Years of education: Not on file   Highest  education level: Not on file  Occupational History   Not on file  Tobacco Use   Smoking status: Every Day    Current packs/day: 0.00    Average packs/day: 0.5 packs/day for 37.5 years (18.7 ttl pk-yrs)    Types: Cigarettes, E-cigarettes    Start date: 65    Last attempt to quit: 06/25/2023    Years since quitting: 0.4   Smokeless tobacco: Never  Vaping Use   Vaping status: Some Days   Substances: Flavoring   Devices: Disposable Vape  Substance and Sexual Activity   Alcohol use: Yes    Comment: occasionally   Drug use: No   Sexual activity: Not on file  Other Topics Concern   Not on file  Social History Narrative   Not on file   Social Drivers of Health   Financial Resource Strain: Not on file  Food Insecurity: Not on file  Transportation Needs: Not on file  Physical Activity: Not on file  Stress: Not on file  Social Connections: Not on file  Intimate Partner Violence: Not on file    Review of Systems: See HPI, otherwise negative ROS  Physical Exam: BP (!) 127/51   Pulse 71   Temp 97.7 F (36.5 C) (Temporal)   Resp 13   Ht 5' 4.02" (1.626 m)  Wt 97.1 kg   LMP 01/15/2016   SpO2 96%   BMI 36.71 kg/m  General:   Alert, cooperative in NAD Head:  Normocephalic and atraumatic. Respiratory:  Normal work of breathing. Cardiovascular:  RRR  Impression/Plan: Shelby Copeland is here for cataract surgery.  Risks, benefits, limitations, and alternatives regarding cataract surgery have been reviewed with the patient.  Questions have been answered.  All parties agreeable.   Galen Manila, MD  12/18/2023, 8:15 AM

## 2023-12-18 NOTE — Transfer of Care (Signed)
Immediate Anesthesia Transfer of Care Note  Patient: Shelby Copeland  Procedure(s) Performed: CATARACT EXTRACTION PHACO AND INTRAOCULAR LENS PLACEMENT (IOC) LEFT VISION BLUE 6.61 00:36.3 (Left)  Patient Location: PACU  Anesthesia Type: General  Level of Consciousness: awake, alert  and patient cooperative  Airway and Oxygen Therapy: Patient Spontanous Breathing and Patient connected to supplemental oxygen  Post-op Assessment: Post-op Vital signs reviewed, Patient's Cardiovascular Status Stable, Respiratory Function Stable, Patent Airway and No signs of Nausea or vomiting  Post-op Vital Signs: Reviewed and stable  Complications: No notable events documented.

## 2023-12-20 ENCOUNTER — Encounter: Payer: Self-pay | Admitting: Ophthalmology

## 2024-09-18 ENCOUNTER — Encounter (HOSPITAL_COMMUNITY): Payer: Self-pay | Admitting: Emergency Medicine

## 2024-09-18 ENCOUNTER — Emergency Department (HOSPITAL_COMMUNITY)
Admission: EM | Admit: 2024-09-18 | Discharge: 2024-09-18 | Discharge status: Against Medical Advice | Attending: Emergency Medicine | Admitting: Emergency Medicine

## 2024-09-18 ENCOUNTER — Other Ambulatory Visit: Payer: Self-pay

## 2024-09-18 DIAGNOSIS — R112 Nausea with vomiting, unspecified: Secondary | ICD-10-CM | POA: Insufficient documentation

## 2024-09-18 DIAGNOSIS — R531 Weakness: Secondary | ICD-10-CM | POA: Diagnosis not present

## 2024-09-18 DIAGNOSIS — Z5321 Procedure and treatment not carried out due to patient leaving prior to being seen by health care provider: Secondary | ICD-10-CM | POA: Insufficient documentation

## 2024-09-18 DIAGNOSIS — R0602 Shortness of breath: Secondary | ICD-10-CM | POA: Insufficient documentation

## 2024-09-18 DIAGNOSIS — R0789 Other chest pain: Secondary | ICD-10-CM | POA: Insufficient documentation

## 2024-09-18 LAB — CBC
HCT: 52 % — ABNORMAL HIGH (ref 36.0–46.0)
Hemoglobin: 17.4 g/dL — ABNORMAL HIGH (ref 12.0–15.0)
MCH: 30 pg (ref 26.0–34.0)
MCHC: 33.5 g/dL (ref 30.0–36.0)
MCV: 89.7 fL (ref 80.0–100.0)
Platelets: 364 K/uL (ref 150–400)
RBC: 5.8 MIL/uL — ABNORMAL HIGH (ref 3.87–5.11)
RDW: 12 % (ref 11.5–15.5)
WBC: 8.9 K/uL (ref 4.0–10.5)
nRBC: 0 % (ref 0.0–0.2)

## 2024-09-18 LAB — COMPREHENSIVE METABOLIC PANEL WITH GFR
ALT: 24 U/L (ref 0–44)
AST: 34 U/L (ref 15–41)
Albumin: 4.4 g/dL (ref 3.5–5.0)
Alkaline Phosphatase: 104 U/L (ref 38–126)
Anion gap: 18 — ABNORMAL HIGH (ref 5–15)
BUN: 19 mg/dL (ref 6–20)
CO2: 22 mmol/L (ref 22–32)
Calcium: 9.7 mg/dL (ref 8.9–10.3)
Chloride: 94 mmol/L — ABNORMAL LOW (ref 98–111)
Creatinine, Ser: 1.2 mg/dL — ABNORMAL HIGH (ref 0.44–1.00)
GFR, Estimated: 54 mL/min — ABNORMAL LOW (ref >60)
Glucose, Bld: 109 mg/dL — ABNORMAL HIGH (ref 70–99)
Potassium: 3.6 mmol/L (ref 3.5–5.1)
Sodium: 134 mmol/L — ABNORMAL LOW (ref 135–145)
Total Bilirubin: 1.4 mg/dL — ABNORMAL HIGH (ref 0.0–1.2)
Total Protein: 8.2 g/dL — ABNORMAL HIGH (ref 6.5–8.1)

## 2024-09-18 LAB — MAGNESIUM: Magnesium: 2.2 mg/dL (ref 1.7–2.4)

## 2024-09-18 LAB — CBG MONITORING, ED: Glucose-Capillary: 124 mg/dL — ABNORMAL HIGH (ref 70–99)

## 2024-09-18 MED ORDER — DIPHENHYDRAMINE HCL 50 MG/ML IJ SOLN
25.0000 mg | Freq: Once | INTRAMUSCULAR | Status: AC
  Administered 2024-09-18: 25 mg via INTRAVENOUS
  Filled 2024-09-18: qty 1

## 2024-09-18 MED ORDER — METOCLOPRAMIDE HCL 5 MG/ML IJ SOLN
5.0000 mg | Freq: Once | INTRAMUSCULAR | Status: AC
  Administered 2024-09-18: 5 mg via INTRAVENOUS
  Filled 2024-09-18: qty 2

## 2024-09-18 MED ORDER — SODIUM CHLORIDE 0.9 % IV BOLUS
1000.0000 mL | Freq: Once | INTRAVENOUS | Status: AC
  Administered 2024-09-18: 1000 mL via INTRAVENOUS

## 2024-09-18 NOTE — ED Provider Triage Note (Signed)
 Emergency Medicine Provider Triage Evaluation Note  Shelby Copeland , a 53 y.o. female  was evaluated in triage.  Pt complains of Shelby Copeland for 2 days associated with nausea and vomiting.  She also endorses generalized weakness.  Review of Systems  Positive: Nausea Negative: Chest pain shortness of breath cough fever urinary symptoms  Physical Exam  BP (!) 102/91   Pulse (!) 114   Temp 98.3 F (36.8 C)   Resp 18   Ht 5' 4 (1.626 m)   Wt 97.1 kg   LMP 01/15/2016   SpO2 100%   BMI 36.74 kg/m  Gen:   Awake, no distress   Resp:  Normal effort  MSK:   Moves extremities without difficulty  Other:    Medical Decision Making  Medically screening exam initiated at 6:15 PM.  Appropriate orders placed.  Shelby Copeland was informed that the remainder of the evaluation will be completed by another provider, this initial triage assessment does not replace that evaluation, and the importance of remaining in the ED until their evaluation is complete.     Shelby Warren SAILOR, PA-C 09/18/24 1815

## 2024-09-18 NOTE — ED Notes (Signed)
Pt notified urine sample is needed. 

## 2024-09-18 NOTE — ED Triage Notes (Signed)
 Pt BIB [facility] coming from home with complaints of n/v for the past 2 days. No fever reported.Per EMS patient reports burning sensation in chest similar to GERD feeling. Patient requesting IV fluids on arrival reporting that she feels dehydrated. Denies pain. c/o chest tightness,SOB, and weakness.   EMS VS: 117 CBG 98.1 160/80

## 2024-09-18 NOTE — ED Notes (Signed)
 Pt requesting IV to be removed before leaving due to wait time. Quick triage RN notified.

## 2024-10-11 ENCOUNTER — Other Ambulatory Visit: Payer: Self-pay | Admitting: Family Medicine

## 2024-10-11 DIAGNOSIS — Z1231 Encounter for screening mammogram for malignant neoplasm of breast: Secondary | ICD-10-CM
